# Patient Record
Sex: Male | Born: 2000 | Race: White | Hispanic: No | Marital: Single | Smoking: Current every day smoker
Health system: Southern US, Community
[De-identification: ages and names within clinical notes are randomized; demographics above are authoritative.]

## PROBLEM LIST (undated history)

## (undated) DIAGNOSIS — F319 Bipolar disorder, unspecified: Secondary | ICD-10-CM

## (undated) DIAGNOSIS — F909 Attention-deficit hyperactivity disorder, unspecified type: Secondary | ICD-10-CM

## (undated) DIAGNOSIS — I10 Essential (primary) hypertension: Secondary | ICD-10-CM

## (undated) DIAGNOSIS — E7800 Pure hypercholesterolemia, unspecified: Secondary | ICD-10-CM

## (undated) HISTORY — PX: WISDOM TOOTH EXTRACTION: SHX21

## (undated) HISTORY — PX: OTHER SURGICAL HISTORY: SHX169

---
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
## 2019-05-19 ENCOUNTER — Emergency Department (HOSPITAL_COMMUNITY): Payer: BC Managed Care – PPO

## 2019-05-19 ENCOUNTER — Other Ambulatory Visit: Payer: Self-pay

## 2019-05-19 ENCOUNTER — Encounter (HOSPITAL_COMMUNITY): Payer: Self-pay | Admitting: Emergency Medicine

## 2019-05-19 ENCOUNTER — Emergency Department (HOSPITAL_COMMUNITY)
Admission: EM | Admit: 2019-05-19 | Discharge: 2019-05-19 | Disposition: A | Discharge status: Home / Self Care | Payer: BC Managed Care – PPO | Attending: Emergency Medicine | Admitting: Emergency Medicine

## 2019-05-19 DIAGNOSIS — Z79899 Other long term (current) drug therapy: Secondary | ICD-10-CM | POA: Insufficient documentation

## 2019-05-19 DIAGNOSIS — F17210 Nicotine dependence, cigarettes, uncomplicated: Secondary | ICD-10-CM | POA: Insufficient documentation

## 2019-05-19 DIAGNOSIS — N5089 Other specified disorders of the male genital organs: Secondary | ICD-10-CM | POA: Insufficient documentation

## 2019-05-19 DIAGNOSIS — I10 Essential (primary) hypertension: Secondary | ICD-10-CM | POA: Diagnosis not present

## 2019-05-19 DIAGNOSIS — N50812 Left testicular pain: Secondary | ICD-10-CM | POA: Diagnosis not present

## 2019-05-19 LAB — URINALYSIS, ROUTINE W REFLEX MICROSCOPIC
Bilirubin Urine: NEGATIVE
Glucose, UA: NEGATIVE mg/dL
Hgb urine dipstick: NEGATIVE
Ketones, ur: NEGATIVE mg/dL
Leukocytes,Ua: NEGATIVE
Nitrite: NEGATIVE
Protein, ur: NEGATIVE mg/dL
Specific Gravity, Urine: 1.006 (ref 1.005–1.030)
pH: 6 (ref 5.0–8.0)

## 2019-05-19 MED ORDER — SODIUM CHLORIDE 0.9 % IV BOLUS: 1000.0000 mL | Freq: Once | INTRAVENOUS | Status: DC

## 2019-05-19 MED ORDER — VANCOMYCIN HCL IN DEXTROSE 1-5 GM/200ML-% IV SOLN: 1000.0000 mg | Freq: Once | INTRAVENOUS | Status: DC

## 2019-05-19 NOTE — Discharge Instructions (Addendum)
There were no acute abnormalities on the ultrasound. I suspect your pain may be due to an inguinal hernia.  Do not perform actions or lifting that seem to cause pain. Follow-up with the general surgeon on this matter.  Call the number provided to set up an appointment.  Return to the emergency department for persistent pain, scrotal swelling, abdominal pain, difficulty urinating, pain with urination, pain with bowel movements, or any other major concerns.

## 2019-05-19 NOTE — ED Provider Notes (Signed)
[facility]-EMERGENCY DEPT Provider Note   CSN: [id number] Arrival date & time: 05/19/19  1641     History   Chief Complaint Chief Complaint  Patient presents with  . Testicle Pain    HPI [name] is a 18 y.o. male.     18 y.o male with a PMH of ADHD, HTN, high cholesterol presents to the ED with a chief complaint of testicular pain x 1 week. Patient reports a fullness sensation to his left testicle for the past week, symptoms had began to improve without any intervention. However, he was lifting a heavy couch yesterday when he felt worsening pain which he rates an 8/10. He has not tried any therapy for improvement in symptoms.Patient was seen at [facility] family medicine this morning and sent to the ED for scrotal ultrasound.  He is currently sexually active without protection, last activity 3-4 months ago. He denies any urinary symptoms, penile discharge, abdominal pain or fevers. No prior history of STI.   The history is provided by the patient and medical records.  Testicle Pain This is a new problem. The current episode started more than 2 days ago. The problem occurs constantly. Pertinent negatives include no chest pain, no abdominal pain, no headaches and no shortness of breath.    Past Medical History:  Diagnosis Date  . ADHD (attention deficit hyperactivity disorder)   . Bipolar 1 disorder (HCC)   . High cholesterol   . Hypertension     There are no active problems to display for this patient.   Past Surgical History:  Procedure Laterality Date  . MASS EXCISION Right 05/25/2016   Procedure: EXCISION cyst from right shin;  Surgeon: Leonia Corona, MD;  Location: [facility];  Service: General;  Laterality: Right;  . removal of foreighn body    . WISDOM TOOTH EXTRACTION          Home Medications    Prior to Admission medications   Medication Sig Start Date End Date Taking? Authorizing Provider  ARIPiprazole (ABILIFY)  10 MG tablet Take 10 mg by mouth at bedtime.  03/17/19  Yes [provider]  cloNIDine HCl (KAPVAY) 0.1 MG TB12 ER tablet Take 0.1 mg by mouth at bedtime.   Yes [provider]  fluticasone (FLONASE) 50 MCG/ACT nasal spray Place 2 sprays into both nostrils daily. Patient taking differently: Place 2 sprays into both nostrils daily as needed for allergies.  08/28/18  Yes Wurst, [place], PA-C  benzonatate (TESSALON) 100 MG capsule Take 1 capsule (100 mg total) by mouth every 8 (eight) hours. Patient not taking: Reported on 05/19/2019 08/28/18   Wurst, [place], PA-C  cetirizine-pseudoephedrine (ZYRTEC-D) 5-120 MG tablet Take 1 tablet by mouth daily. Patient not taking: Reported on 05/19/2019 08/28/18   Rennis Harding, PA-C    Family History Family History  Problem Relation Age of Onset  . Hypertension Sister   . Hypertension Maternal Grandmother   . COPD Maternal Grandmother   . Asthma Maternal Grandmother   . Stroke Maternal Grandmother   . Kidney disease Maternal Grandmother   . Diabetes Maternal Grandfather   . Hypertension Paternal Grandfather     Social History Social History   Tobacco Use  . Smoking status: Current Every Day Smoker    Packs/day: 0.50    Types: Cigarettes  . Smokeless tobacco: Never Used  Substance Use Topics  . Alcohol use: No  . Drug use: No     Allergies   Patient has  no known allergies.   Review of Systems Review of Systems  Constitutional: Negative for fever.  HENT: Negative for sore throat.   Respiratory: Negative for shortness of breath.   Cardiovascular: Negative for chest pain.  Gastrointestinal: Negative for abdominal pain, nausea and vomiting.  Genitourinary: Positive for testicular pain. Negative for difficulty urinating, discharge, dysuria, flank pain, genital sores, hematuria, penile pain, penile swelling and scrotal swelling.  Musculoskeletal: Negative for back pain.  Skin: Negative for pallor and wound.   Neurological: Negative for light-headedness and headaches.     Physical Exam Updated Vital Signs BP 121/65 (BP Location: Left Arm)   Pulse 65   Temp 98.9 F (37.2 C) (Oral)   Resp 17   SpO2 99%   Physical Exam Vitals signs and nursing note reviewed. Exam conducted with a chaperone present.  Constitutional:      Appearance: Normal appearance.  HENT:     Head: Normocephalic and atraumatic.     Nose: Nose normal.     Mouth/Throat:     Mouth: Mucous membranes are moist.  Eyes:     Pupils: Pupils are equal, round, and reactive to light.  Neck:     Musculoskeletal: Normal range of motion and neck supple.  Cardiovascular:     Rate and Rhythm: Normal rate.     Heart sounds: No murmur.  Pulmonary:     Effort: Pulmonary effort is normal.     Breath sounds: Normal breath sounds. No wheezing.  Abdominal:     General: Abdomen is flat. Bowel sounds are normal. There is no distension.     Tenderness: There is no abdominal tenderness.  Genitourinary:    Pubic Area: No rash or pubic lice.      Penis: Circumcised.      Scrotum/Testes:        Right: Tenderness, swelling, testicular hydrocele or varicocele not present. Right testis is descended.        Left: Tenderness and swelling present. Varicocele not present. Left testis is descended. Cremasteric reflex is present.      Comments: Chaperoned by Ruthy DickZach RN.  Significant tenderness to palpation along the left scrotum.  No penile discharge, no groin lymphadenopathy. Neurological:     Mental Status: He is alert and oriented to person, place, and time.      ED Treatments / Results  Labs (all labs ordered are listed, but only abnormal results are displayed) Labs Reviewed  URINALYSIS, ROUTINE W REFLEX MICROSCOPIC - Abnormal; Notable for the following components:      Result Value   Color, Urine STRAW (*)    All other components within normal limits  GC/CHLAMYDIA PROBE AMP ([place]) NOT AT [facility]    EKG None  Radiology No  results found.  Procedures Procedures (including critical care time)  Medications Ordered in ED Medications - No data to display   Initial Impression / Assessment and Plan / ED Course  I have reviewed the triage vital signs and the nursing notes.  Pertinent labs & imaging results that were available during my care of the patient were reviewed by me and considered in my medical decision making (see chart for details).       Patient with a past medical history of hypertension presents to the ED with complaints of left testicular pain x1 week.  He reports his symptoms are waxing and waning and certainly improve however, after moving some furniture yesterday he felt increased pain to his left testicle.  He was seen at family  practice today and sent here for an ultrasound of his testicle.  He relates report being sexually active last time he did so was 3 to 4 months ago, reports he has had no penile drainage, penile swelling, lymphadenopathy around the groin.  No urinary symptoms.  My evaluation there is some swelling around the epididymis on his left testicle, right testicle appears unremarkable.  No groin lymphadenopathy.  Vitals are within normal limits.  Will send UA along with ultrasound of his testicles.  Return diagnoses included but not limited to hydrocele, epididymitis, testicular torsion.  UA showed no nitrites, leukocytes, white blood cell count.  GC and Chlamydia have been collected of his urine and are currently pending results.  An ultrasound of his testicles was ordered add 6 PM.  I have personally called ultrasound technician on call Raquel Sarna at [phone].  She was unaware the studies had been ordered therefore there has been a delay in obtaining ultrasound.  Patient is aware he will need his ultrasound prior to disposition.  Patient CARE signed out to Georga Kaufmann, [place] pending ultrasound scrotal.   Portions of this note were generated with Dragon dictation software. Dictation  errors may occur despite best attempts at proofreading.  Final Clinical Impressions(s) / ED Diagnoses   Final diagnoses:  Left testicular pain  Testicular swelling, left    ED Discharge Orders    None       [name], Hershal Coria 05/19/19 2048    Daleen Bo, MD 05/20/19 [phone]

## 2019-05-19 NOTE — ED Provider Notes (Signed)
[name] is a 18 y.o. male, presenting to the ED with groin pain beginning about a week ago.  Last week, he was lifting a heavy piece of metal and felt pain in the left groin.  That pain improved and then resolved with rest.  The pain recurred last night while lifting a couch, resolved with rest.  He has had no trouble having bowel movements and had a bowel movement today. Denies pain with bowel movements, difficulty urinating, pain with urination, scrotal swelling, fever, abdominal pain.   HPI from [name], [name]: "18 y.o male with a PMH of ADHD, HTN, high cholesterol presents to the ED with a chief complaint of testicular pain x 1 week. Patient reports a fullness sensation to his left testicle for the past week, symptoms had began to improve without any intervention. However, he was lifting a heavy couch yesterday when he felt worsening pain which he rates an 8/10. He has not tried any therapy for improvement in symptoms.[name] this [facility] and sent to the ED for scrotal ultrasound.  He is currently sexually active without protection, last activity 3-4 months ago. He denies any urinary symptoms, penile discharge, abdominal pain or fevers. No prior history of STI.   The history is provided by the patient and medical records.  Testicle Pain This is a new problem. The current episode started more than 2 days ago. The problem occurs constantly. Pertinent negatives include no chest pain, no abdominal pain, no headaches and no shortness of breath."   Past Medical History:  Diagnosis Date  . ADHD (attention deficit hyperactivity disorder)   . Bipolar 1 disorder ([place])   . High cholesterol   . Hypertension     Physical Exam  BP 121/65 (BP Location: Left Arm)   Pulse 65   Temp 98.9 F (37.2 C) (Oral)   Resp 17   SpO2 99%   Physical Exam Vitals signs and nursing note reviewed.  Constitutional:      General: He is not in acute distress.  Appearance: He is well-developed. He is not diaphoretic.  HENT:     Head: Normocephalic and atraumatic.     Mouth/Throat:     Mouth: Mucous membranes are moist.     Pharynx: Oropharynx is clear.  Eyes:     Conjunctiva/sclera: Conjunctivae normal.  Neck:     Musculoskeletal: Neck supple.  Cardiovascular:     Rate and Rhythm: Normal rate and regular rhythm.     Pulses: Normal pulses.     Comments: Tactile temperature in the extremities appropriate and equal bilaterally. Pulmonary:     Effort: Pulmonary effort is normal. No respiratory distress.  Abdominal:     Palpations: Abdomen is soft.     Tenderness: There is no abdominal tenderness. There is no guarding.  Genitourinary:    Comments: Genital Exam: Penis, scrotum, and testicles without swelling, lesions, or tenderness. No penile discharge.   No inguinal hernia noted, however, there is tenderness in the left inguinal canal. Cremasteric reflex intact. No inguinal lymphadenopathy.  Otherwise normal male genitalia.  Musculoskeletal:     Right lower leg: No edema.     Left lower leg: No edema.  Lymphadenopathy:     Cervical: No cervical adenopathy.  Skin:    General: Skin is warm and dry.  Neurological:     Mental Status: He is alert.  Psychiatric:        Mood and Affect: Mood and affect normal.  Speech: Speech normal.        Behavior: Behavior normal.     ED Course/Procedures     Procedures   Abnormal Labs Reviewed  URINALYSIS, ROUTINE W REFLEX MICROSCOPIC - Abnormal; Notable for the following components:      Result Value   Color, Urine STRAW (*)    All other components within normal limits    US Scrotum W/doppler  Result Date: 05/19/2019 CLINICAL DATA:  Right testicular pain. EXAM: SCROTAL ULTRASOUND DOPPLER ULTRASOUND OF THE TESTICLES TECHNIQUE: Complete ultrasound examination of the testicles, epididymis, and other scrotal structures was performed. Color and spectral Doppler ultrasound were also utilized  to evaluate blood flow to the testicles. COMPARISON:  None. FINDINGS: Right testicle Measurements: 4.4 x 2.8 x 1.8 cm. Homogeneous echogenicity. Normal blood flow. No mass or microlithiasis visualized. Left testicle Measurements: 4.2 x 2.0 x 2.7 cm. Homogeneous echogenicity. Normal blood flow. No mass or microlithiasis visualized. Right epididymis:  Normal in size and appearance. Left epididymis:  Normal in size and appearance. Hydrocele:  None visualized. Varicocele:  None visualized. Pulsed Doppler interrogation of both testes demonstrates normal low resistance arterial and venous waveforms bilaterally. IMPRESSION: Normal scrotal ultrasound with Doppler. Electronically Signed   By: Narda Rutherford M.D.   On: 05/19/2019 22:00    MDM   Patient care handoff report received from [name], [place]. Plan: Review scrotal ultrasound.  Patient presents with groin pain intermittent over the last week.  No acute abnormality on ultrasound this evening. Based on history elements, I do have a suspicion for inguinal hernia.  Based on the intermittent nature of his pain and the lack of pain during ED course, my suspicion for incarcerated hernia is low. General surgery follow-up.  Strict return precautions discussed.  Patient voices understanding of these instructions, accepts the plan, and is comfortable with discharge.  Vitals:   05/19/19 1707 05/19/19 2005 05/19/19 2230 05/19/19 2324  BP: (!) 154/89 121/65 130/69 134/88  Pulse: 70 65 73 69  Resp: 19 17 18 16   Temp: 98.9 F (37.2 C)     TempSrc: Oral     SpO2: 100% 99% 100% 100%       , PA-C 05/20/19 0010    Little, 05/22/19, MD 05/22/19 [phone]

## 2019-05-19 NOTE — ED Triage Notes (Signed)
Patient here from [facility] for [place] of left testicle. Reports pain 1 week ago, increased 2 days ago. Also states that he was lifting a couch yesterday and pain became a 8/10.

## 2019-05-21 LAB — GC/CHLAMYDIA PROBE AMP (~~LOC~~) NOT AT ARMC
Chlamydia: NEGATIVE
Neisseria Gonorrhea: NEGATIVE

## 2020-04-07 ENCOUNTER — Other Ambulatory Visit: Payer: Self-pay

## 2020-04-07 ENCOUNTER — Encounter (HOSPITAL_COMMUNITY): Payer: Self-pay

## 2020-04-07 ENCOUNTER — Emergency Department (HOSPITAL_COMMUNITY)
Admission: EM | Admit: 2020-04-07 | Discharge: 2020-04-07 | Disposition: A | Discharge status: Home / Self Care | Payer: BC Managed Care – PPO | Attending: Emergency Medicine | Admitting: Emergency Medicine

## 2020-04-07 DIAGNOSIS — X509XXA Other and unspecified overexertion or strenuous movements or postures, initial encounter: Secondary | ICD-10-CM | POA: Diagnosis not present

## 2020-04-07 DIAGNOSIS — F909 Attention-deficit hyperactivity disorder, unspecified type: Secondary | ICD-10-CM | POA: Diagnosis not present

## 2020-04-07 DIAGNOSIS — F17210 Nicotine dependence, cigarettes, uncomplicated: Secondary | ICD-10-CM | POA: Diagnosis not present

## 2020-04-07 DIAGNOSIS — Z79899 Other long term (current) drug therapy: Secondary | ICD-10-CM | POA: Insufficient documentation

## 2020-04-07 DIAGNOSIS — Y999 Unspecified external cause status: Secondary | ICD-10-CM | POA: Insufficient documentation

## 2020-04-07 DIAGNOSIS — Y939 Activity, unspecified: Secondary | ICD-10-CM | POA: Insufficient documentation

## 2020-04-07 DIAGNOSIS — S46211A Strain of muscle, fascia and tendon of other parts of biceps, right arm, initial encounter: Secondary | ICD-10-CM

## 2020-04-07 DIAGNOSIS — I10 Essential (primary) hypertension: Secondary | ICD-10-CM | POA: Diagnosis not present

## 2020-04-07 DIAGNOSIS — Y9289 Other specified places as the place of occurrence of the external cause: Secondary | ICD-10-CM | POA: Insufficient documentation

## 2020-04-07 NOTE — ED Triage Notes (Signed)
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

## 2020-04-07 NOTE — ED Provider Notes (Signed)
[facility] EMERGENCY DEPARTMENT Provider Note  CSN: [id number] Arrival date & time: 04/07/20 1929    History Chief Complaint  Patient presents with  . Arm Pain    HPI  [name] is a 19 y.o. male was at work earlier today pulling down some strapping and felt a sharp pain in his R distal biceps area. 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 Told he had 'rolled his bicep'. Pain has since improved.    Past Medical History:  Diagnosis Date  . ADHD (attention deficit hyperactivity disorder)   . Bipolar 1 disorder (HCC)   . High cholesterol   . Hypertension     Past Surgical History:  Procedure Laterality Date  . MASS EXCISION Right 05/25/2016   Procedure: EXCISION cyst from right shin;  Surgeon: Leonia Corona, MD;  Location: [facility];  Service: General;  Laterality: Right;  . removal of foreighn body    . WISDOM TOOTH EXTRACTION      Family History  Problem Relation Age of Onset  . Hypertension Sister   . Hypertension Maternal Grandmother   . COPD Maternal Grandmother   . Asthma Maternal Grandmother   . Stroke Maternal Grandmother   . Kidney disease Maternal Grandmother   . Diabetes Maternal Grandfather   . Hypertension Paternal Grandfather     Social History   Tobacco Use  . Smoking status: Current Every Day Smoker    Packs/day: 0.50    Types: Cigarettes  . Smokeless tobacco: Never Used  Vaping Use  . Vaping Use: Former  . Start date: 07/10/2016  . Quit date: 05/30/2018  Substance Use Topics  . Alcohol use: No  . Drug use: No     Home Medications Prior to Admission medications   Medication Sig Start Date End Date Taking? Authorizing Provider  ARIPiprazole (ABILIFY) 10 MG tablet Take 10 mg by mouth at bedtime.  03/17/19   [provider]  benzonatate (TESSALON) 100 MG capsule Take 1 capsule (100 mg total) by mouth every 8 (eight) hours. Patient not taking: Reported on  05/19/2019 08/28/18   Wurst, [place], PA-C  cetirizine-pseudoephedrine (ZYRTEC-D) 5-120 MG tablet Take 1 tablet by mouth daily. Patient not taking: Reported on 05/19/2019 08/28/18   Wurst, [place], PA-C  cloNIDine HCl (KAPVAY) 0.1 MG TB12 ER tablet Take 0.1 mg by mouth at bedtime.    [provider]  fluticasone (FLONASE) 50 MCG/ACT nasal spray Place 2 sprays into both nostrils daily. Patient taking differently: Place 2 sprays into both nostrils daily as needed for allergies.  08/28/18   Wurst, [place], PA-C     Allergies    Patient has no known allergies.   Review of Systems   Review of Systems A comprehensive review of systems was completed and negative except as noted in HPI.    Physical Exam BP (!) 151/92 (BP Location: Left Arm)   Pulse 90   Temp 99.4 F (37.4 C) (Oral)   Resp 18   Ht 5\' 8"  (1.727 m)   Wt 67 kg   SpO2 96%   BMI 22.46 kg/m   Physical Exam Vitals and nursing note reviewed.  HENT:     Head: Normocephalic.     Nose: Nose normal.  Eyes:     Extraocular Movements: Extraocular movements intact.  Pulmonary:     Effort: Pulmonary effort is normal.  Musculoskeletal:        General: Tenderness (  distal biceps tendon area) present. No swelling or deformity. Normal range of motion.     Cervical back: Neck supple.  Skin:    Findings: No rash (on exposed skin).  Neurological:     Mental Status: He is alert and oriented to person, place, and time.  Psychiatric:        Mood and Affect: Mood normal.      ED Results / Procedures / Treatments   Labs (all labs ordered are listed, but only abnormal results are displayed) Labs Reviewed - No data to display  EKG None  Radiology No results found.  Procedures Procedures  Medications Ordered in the ED Medications - No data to display   MDM Rules/Calculators/A&P MDM Patient with FROM, no contracted biceps to suggest rupture but could have a partial tear. Recommend sling, NSAIDs, ice and rest.  Ortho followup if not improving. RTED for any other concern.  ED Course  I have reviewed the triage vital signs and the nursing notes.  Pertinent labs & imaging results that were available during my care of the patient were reviewed by me and considered in my medical decision making (see chart for details).     Final Clinical Impression(s) / ED Diagnoses Final diagnoses:  Strain of biceps tendon, right, initial encounter    Rx / DC Orders ED Discharge Orders    None       Pollyann Savoy, MD 04/07/20 2221

## 2020-07-20 ENCOUNTER — Ambulatory Visit
Admission: EM | Admit: 2020-07-20 | Discharge: 2020-07-20 | Disposition: A | Discharge status: Home / Self Care | Payer: BC Managed Care – PPO

## 2020-07-20 ENCOUNTER — Other Ambulatory Visit: Payer: Self-pay

## 2020-07-20 DIAGNOSIS — Z20822 Contact with and (suspected) exposure to covid-19: Secondary | ICD-10-CM | POA: Diagnosis not present

## 2020-07-20 DIAGNOSIS — J069 Acute upper respiratory infection, unspecified: Secondary | ICD-10-CM

## 2020-07-20 MED ORDER — FLUTICASONE PROPIONATE 50 MCG/ACT NA SUSP: 1.0000 | Freq: Every day | NASAL | 0 refills | Status: AC

## 2020-07-20 MED ORDER — BENZONATATE 200 MG PO CAPS: 200.0000 mg | ORAL_CAPSULE | Freq: Three times a day (TID) | ORAL | 0 refills | Status: AC | PRN

## 2020-07-20 NOTE — Discharge Instructions (Signed)
Covid test pending, monitor my chart for results Rest and fluids Ibuprofen and Tylenol for any fevers headaches body aches May use Flonase provided for congestion or over-the-counter Zyrtec/Claritin or Mucinex or Sudafed May use Tessalon for cough or over-the-counter Robitussin Delsym, Dimetapp Follow-up if not improving or worsening

## 2020-07-20 NOTE — ED Provider Notes (Signed)
[facility]    CSN: [id number] Arrival date & time: 07/20/20  0940      History   Chief Complaint Chief Complaint  Patient presents with   Nasal Congestion    HPI [name] is a 19 y.o. male presenting today for evaluation of cough and congestion.  Reports had mild sore throat over the weekend which has since resolved.  Reports cough and congestion over the past 1 to 2 days.  Denies fevers chills or body aches.  Denies GI symptoms.  Denies any close sick contacts.  HPI  Past Medical History:  Diagnosis Date   ADHD (attention deficit hyperactivity disorder)    Bipolar 1 disorder (HCC)    High cholesterol    Hypertension     There are no problems to display for this patient.   Past Surgical History:  Procedure Laterality Date   MASS EXCISION Right 05/25/2016   Procedure: EXCISION cyst from right shin;  Surgeon: Leonia Corona, MD;  Location: [facility];  Service: General;  Laterality: Right;   removal of foreighn body     WISDOM TOOTH EXTRACTION         Home Medications    Prior to Admission medications   Medication Sig Start Date End Date Taking? Authorizing Provider  ARIPiprazole (ABILIFY) 10 MG tablet Take 10 mg by mouth at bedtime.  03/17/19   [provider]  benzonatate (TESSALON) 200 MG capsule Take 1 capsule (200 mg total) by mouth 3 (three) times daily as needed for up to 7 days for cough. 07/20/20 07/27/20  Larita Deremer C, PA-C  busPIRone (BUSPAR) 10 MG tablet Take 10 mg by mouth 1 day or 1 dose.    [provider]  cloNIDine HCl (KAPVAY) 0.1 MG TB12 ER tablet Take 0.1 mg by mouth at bedtime.    [provider]  fluticasone (FLONASE) 50 MCG/ACT nasal spray Place 1-2 sprays into both nostrils daily. 07/20/20   Danalee Flath, Junius Creamer, PA-C    Family History Family History  Problem Relation Age of Onset   Hypertension Sister    Hypertension Maternal Grandmother    COPD Maternal  Grandmother    Asthma Maternal Grandmother    Stroke Maternal Grandmother    Kidney disease Maternal Grandmother    Diabetes Maternal Grandfather    Hypertension Paternal Grandfather     Social History Social History   Tobacco Use   Smoking status: Current Every Day Smoker    Packs/day: 0.50    Types: Cigarettes, E-cigarettes   Smokeless tobacco: Never Used  Vaping Use   Vaping Use: Former   Start date: 07/10/2016   Quit date: 05/30/2018  Substance Use Topics   Alcohol use: No   Drug use: No     Allergies   Patient has no known allergies.   Review of Systems Review of Systems  Constitutional: Negative for activity change, appetite change, chills, fatigue and fever.  HENT: Positive for congestion and rhinorrhea. Negative for ear pain, sinus pressure, sore throat and trouble swallowing.   Eyes: Negative for discharge and redness.  Respiratory: Positive for cough. Negative for chest tightness and shortness of breath.   Cardiovascular: Negative for chest pain.  Gastrointestinal: Negative for abdominal pain, diarrhea, nausea and vomiting.  Musculoskeletal: Negative for myalgias.  Skin: Negative for rash.  Neurological: Negative for dizziness, light-headedness and headaches.     Physical Exam Triage Vital Signs ED Triage Vitals  Enc Vitals Group     BP  Pulse      Resp      Temp      Temp src      SpO2      Weight      Height      Head Circumference      Peak Flow      Pain Score      Pain Loc      Pain Edu?      Excl. in [place]?    No data found.  Updated Vital Signs BP 131/88 (BP Location: Left Arm)    Pulse 89    Temp 99.1 F (37.3 C) (Oral)    Resp 18    SpO2 97%   Visual Acuity Right Eye Distance:   Left Eye Distance:   Bilateral Distance:    Right Eye Near:   Left Eye Near:    Bilateral Near:     Physical Exam Vitals and nursing note reviewed.  Constitutional:      Appearance: He is well-developed and well-nourished.      Comments: No acute distress  HENT:     Head: Normocephalic and atraumatic.     Ears:     Comments: Bilateral ears without tenderness to palpation of external auricle, tragus and mastoid, EAC's without erythema or swelling, TM's with good bony landmarks and cone of light. Non erythematous.     Nose: Nose normal.     Mouth/Throat:     Comments: Oral mucosa pink and moist, no tonsillar enlargement or exudate. Posterior pharynx patent and nonerythematous, no uvula deviation or swelling. Normal phonation. Eyes:     Conjunctiva/sclera: Conjunctivae normal.  Cardiovascular:     Rate and Rhythm: Normal rate.  Pulmonary:     Effort: Pulmonary effort is normal. No respiratory distress.     Comments: Breathing comfortably at rest, CTABL, no wheezing, rales or other adventitious sounds auscultated Abdominal:     General: There is no distension.  Musculoskeletal:        General: Normal range of motion.     Cervical back: Neck supple.  Skin:    General: Skin is warm and dry.  Neurological:     Mental Status: He is alert and oriented to person, place, and time.  Psychiatric:        Mood and Affect: Mood and affect normal.      UC Treatments / Results  Labs (all labs ordered are listed, but only abnormal results are displayed) Labs Reviewed  NOVEL CORONAVIRUS, NAA    EKG   Radiology No results found.  Procedures Procedures (including critical care time)  Medications Ordered in UC Medications - No data to display  Initial Impression / Assessment and Plan / UC Course  I have reviewed the triage vital signs and the nursing notes.  Pertinent labs & imaging results that were available during my care of the patient were reviewed by me and considered in my medical decision making (see chart for details).     Viral URI with cough-Covid test pending, suspect likely viral etiology, vital signs stable, exam reassuring.  Symptomatic and supportive care.  Continue to monitor,Discussed  strict return precautions. Patient verbalized understanding and is agreeable with plan.    Final Clinical Impressions(s) / UC Diagnoses   Final diagnoses:  Encounter for screening laboratory testing for COVID-19 virus  Viral URI with cough     Discharge Instructions     Covid test pending, monitor my chart for results Rest and fluids Ibuprofen and Tylenol  for any fevers headaches body aches May use Flonase provided for congestion or over-the-counter Zyrtec/Claritin or Mucinex or Sudafed May use Tessalon for cough or over-the-counter Robitussin Delsym, Dimetapp Follow-up if not improving or worsening    ED Prescriptions    Medication Sig Dispense Auth. Provider   benzonatate (TESSALON) 200 MG capsule Take 1 capsule (200 mg total) by mouth 3 (three) times daily as needed for up to 7 days for cough. 28 capsule [address] C, PA-C   fluticasone (FLONASE) 50 MCG/ACT nasal spray Place 1-2 sprays into both nostrils daily. 16 g Zaccai Chavarin, [place] C, PA-C     PDMP not reviewed this encounter.   Lew Dawes, PA-C 07/20/20 1008

## 2020-07-20 NOTE — ED Triage Notes (Signed)
Pt c/o nasal/chest congestion with a cough since yesterday. Needs covid testing and work note.

## 2020-07-22 LAB — SARS-COV-2, NAA 2 DAY TAT

## 2020-07-22 LAB — NOVEL CORONAVIRUS, NAA: SARS-CoV-2, NAA: NOT DETECTED

## 2021-12-13 ENCOUNTER — Ambulatory Visit
Admission: EM | Admit: 2021-12-13 | Discharge: 2021-12-13 | Disposition: A | Discharge status: Home / Self Care | Payer: Medicaid Other | Attending: Emergency Medicine | Admitting: Emergency Medicine

## 2021-12-13 ENCOUNTER — Ambulatory Visit (INDEPENDENT_AMBULATORY_CARE_PROVIDER_SITE_OTHER): Payer: Medicaid Other

## 2021-12-13 DIAGNOSIS — S6991XA Unspecified injury of right wrist, hand and finger(s), initial encounter: Secondary | ICD-10-CM | POA: Diagnosis not present

## 2021-12-13 NOTE — ED Provider Notes (Signed)
?[facility] ? ? ? ?CSN: [id number] ?Arrival date & time: 12/13/21  1620 ? ? ?  ? ?History   ?Chief Complaint ?Chief Complaint  ?Patient presents with  ? Hand Pain  ?  R  ? ? ?HPI ?[name] is a 21 y.o. male.  ?Presents after a hand injury. Around 11 this morning patient was hit in the hand with an instrument at work. Had pain and swelling of the hand. Went home and applied ice which helped with swelling but pain has continued. Patient is worried as he is right handed and wants to make sure nothing is broken. ?Pain mostly with extension of fingers. ? ?Past Medical History:  ?Diagnosis Date  ? ADHD (attention deficit hyperactivity disorder)   ? Bipolar 1 disorder ([place])   ? High cholesterol   ? Hypertension   ? ? ?Patient Active Problem List  ? Diagnosis Date Noted  ? Hand injury, right, initial encounter 12/13/2021  ? ? ?Past Surgical History:  ?Procedure Laterality Date  ? MASS EXCISION Right 05/25/2016  ? Procedure: EXCISION cyst from right shin;  Surgeon: Gerald Stabs, MD;  Location: [place];  Service: General;  Laterality: Right;  ? removal of foreighn body    ? WISDOM TOOTH EXTRACTION    ? ? ? ?Home Medications   ? ?Prior to Admission medications   ?Medication Sig Start Date End Date Taking? Authorizing Provider  ?ARIPiprazole (ABILIFY) 10 MG tablet Take 10 mg by mouth at bedtime.  03/17/19   [provider]  ?busPIRone (BUSPAR) 10 MG tablet Take 10 mg by mouth 1 day or 1 dose.    [provider]  ?cloNIDine HCl (KAPVAY) 0.1 MG TB12 ER tablet Take 0.1 mg by mouth at bedtime.    [provider]  ?fluticasone (FLONASE) 50 MCG/ACT nasal spray Place 1-2 sprays into both nostrils daily. 07/20/20   Wieters, Hallie C, PA-C  ? ? ?Family History ?Family History  ?Problem Relation Age of Onset  ? Hypertension Sister   ? Hypertension Maternal Grandmother   ? COPD Maternal Grandmother   ? Asthma Maternal Grandmother   ? Stroke Maternal Grandmother   ? Kidney  disease Maternal Grandmother   ? Diabetes Maternal Grandfather   ? Hypertension Paternal Grandfather   ? ? ?Social History ?Social History  ? ?Tobacco Use  ? Smoking status: Every Day  ?  Packs/day: 0.50  ?  Types: Cigarettes, E-cigarettes  ? Smokeless tobacco: Never  ?Vaping Use  ? Vaping Use: Former  ? Start date: 07/10/2016  ? Quit date: 05/30/2018  ?Substance Use Topics  ? Alcohol use: No  ? Drug use: No  ? ? ? ?Allergies   ?Patient has no known allergies. ? ? ?Review of Systems ?Review of Systems  ?Constitutional: Negative.   ?HENT: Negative.    ?Eyes: Negative.   ?Respiratory: Negative.    ?Cardiovascular: Negative.   ?Gastrointestinal: Negative.   ?Musculoskeletal:   ?     Right hand pain  ?All other systems reviewed and are negative. ? ?Physical Exam ?Triage Vital Signs ?ED Triage Vitals  ?Enc Vitals Group  ?   BP 12/13/21 1720 125/76  ?   Pulse Rate 12/13/21 1720 71  ?   Resp 12/13/21 1720 18  ?   Temp 12/13/21 1720 97.9 ?F (36.6 ?C)  ?   Temp Source 12/13/21 1720 Oral  ?   SpO2 12/13/21 1720 97 %  ?   Weight --   ?  Height --   ?   Head Circumference --   ?   Peak Flow --   ?   Pain Score 12/13/21 1719 8  ?   Pain Loc --   ?   Pain Edu? --   ?   Excl. in [place]? --   ? ?No data found. ? ?Updated Vital Signs ?BP 125/76 (BP Location: Left Arm)   Pulse 71   Temp 97.9 ?F (36.6 ?C) (Oral)   Resp 18   SpO2 97%  ? ? ?Physical Exam ?Vitals and nursing note reviewed.  ?Constitutional:   ?   General: He is not in acute distress. ?   Appearance: He is well-developed.  ?Eyes:  ?   Conjunctiva/sclera: Conjunctivae normal.  ?Cardiovascular:  ?   Rate and Rhythm: Normal rate and regular rhythm.  ?   Heart sounds: Normal heart sounds.  ?Pulmonary:  ?   Effort: Pulmonary effort is normal. No respiratory distress.  ?   Breath sounds: Normal breath sounds.  ?Musculoskeletal:  ?   Right hand: Tenderness present. No swelling or deformity. Decreased range of motion. Normal strength. Normal sensation.  ?   Comments: Pain with  extension of fingers. Mostly located to medial palm at base of 4th and 5th fingers. No snuff box tenderness. Sensation intact. Radial pulse 3+  ?Neurological:  ?   Mental Status: He is alert.  ? ? ?UC Treatments / Results  ?Labs ?(all labs ordered are listed, but only abnormal results are displayed) ?Labs Reviewed - No data to display ? ?EKG ? ?Radiology ?DG Hand Complete Right ? ?Result Date: 12/13/2021 ?CLINICAL DATA:  Right hand pain after trauma. EXAM: RIGHT HAND - COMPLETE 3+ VIEW COMPARISON:  None Available. FINDINGS: There is no evidence of fracture or dislocation. There is no evidence of arthropathy or other focal bone abnormality. Soft tissues are unremarkable. IMPRESSION: No acute osseous abnormality. Electronically Signed   By: Dahlia Bailiff M.D.   On: 12/13/2021 17:59   ? ?Procedures ?Procedures (including critical care time) ? ?Medications Ordered in UC ?Medications - No data to display ? ?Initial Impression / Assessment and Plan / UC Course  ?I have reviewed the triage vital signs and the nursing notes. ? ?Pertinent labs & imaging results that were available during my care of the patient were reviewed by me and considered in my medical decision making (see chart for details). ?  ?Obtained right hand xray in clinic today - shows no abnormality. Discussed with patient to use ibuprofen for pain, ice for swelling, and to continue gentle movements of hand. He can follow up with primary care if symptoms do not improve with symptomatic care. ?Return precautions discussed. Patient agrees to plan and is discharged in stable condition. ? ?Final Clinical Impressions(s) / UC Diagnoses  ? ?Final diagnoses:  ?Hand injury, right, initial encounter  ? ? ? ?Discharge Instructions   ? ?  ?Please return to the urgent care or emergency department if symptoms worsen or do not improve. ? ? ? ?ED Prescriptions   ?None ?  ? ?PDMP not reviewed this encounter. ?  ?Verble Styron, Wells Guiles, PA-C ?12/13/21 1821 ? ?

## 2021-12-13 NOTE — ED Triage Notes (Signed)
Today, while trying to take a tire off Pt reports the instrument "broke loose and struck" his right palm causing a sudden onset of pain. Confirms swelling. No bruising. ?No meds taken. ?

## 2021-12-13 NOTE — Discharge Instructions (Signed)
Please return to the urgent care or emergency department if symptoms worsen or do not improve. ?

## 2022-03-08 ENCOUNTER — Ambulatory Visit
Admission: EM | Admit: 2022-03-08 | Discharge: 2022-03-08 | Disposition: A | Discharge status: Home / Self Care | Payer: Medicaid Other | Attending: Internal Medicine | Admitting: Internal Medicine

## 2022-03-08 DIAGNOSIS — M62838 Other muscle spasm: Secondary | ICD-10-CM | POA: Diagnosis not present

## 2022-03-08 MED ORDER — CYCLOBENZAPRINE HCL 5 MG PO TABS: 5.0000 mg | ORAL_TABLET | Freq: Two times a day (BID) | ORAL | 0 refills | Status: AC | PRN

## 2022-03-08 NOTE — ED Provider Notes (Signed)
[facility]    CSN: [id number] Arrival date & time: 03/08/22  1311      History   Chief Complaint Chief Complaint  Patient presents with   leg spasms    HPI [name] is a 21 y.o. male.   Patient presents with intermittent leg spasms that have been present for about 3 weeks.  Spasming is mainly occurring in the upper thighs but does extend down to the lower legs at times.  He reports that it is mainly worse at night when he is sitting still but is present intermittently throughout the day.  Denies any associated chest pain, shortness of breath, headache, dizziness, blurred vision, nausea, vomiting.  Patient states that this has occurred before and he had low potassium.  Denies any associated urinary symptoms as well.  Denies any injuries to the area.  He reports that he does work outside in [employer] but has been drinking plenty of water.     Past Medical History:  Diagnosis Date   ADHD (attention deficit hyperactivity disorder)    Bipolar 1 disorder (HCC)    High cholesterol    Hypertension     Patient Active Problem List   Diagnosis Date Noted   Hand injury, right, initial encounter 12/13/2021    Past Surgical History:  Procedure Laterality Date   MASS EXCISION Right 05/25/2016   Procedure: EXCISION cyst from right shin;  Surgeon: Leonia Corona, MD;  Location: [facility];  Service: General;  Laterality: Right;   removal of foreighn body     WISDOM TOOTH EXTRACTION         Home Medications    Prior to Admission medications   Medication Sig Start Date End Date Taking? Authorizing Provider  cyclobenzaprine (FLEXERIL) 5 MG tablet Take 1 tablet (5 mg total) by mouth 2 (two) times daily as needed for muscle spasms. 03/08/22  Yes Hamish Banks, Rolly Salter E, FNP  ARIPiprazole (ABILIFY) 10 MG tablet Take 10 mg by mouth at bedtime.  03/17/19   [provider]  busPIRone (BUSPAR) 10 MG tablet Take 10 mg by mouth 1 day or 1 dose.    [provider]  cloNIDine HCl (KAPVAY) 0.1 MG TB12 ER tablet Take 0.1 mg by mouth at bedtime.    [provider]  fluticasone (FLONASE) 50 MCG/ACT nasal spray Place 1-2 sprays into both nostrils daily. 07/20/20   Wieters, Junius Creamer, PA-C    Family History Family History  Problem Relation Age of Onset   Hypertension Sister    Hypertension Maternal Grandmother    COPD Maternal Grandmother    Asthma Maternal Grandmother    Stroke Maternal Grandmother    Kidney disease Maternal Grandmother    Diabetes Maternal Grandfather    Hypertension Paternal Grandfather     Social History Social History   Tobacco Use   Smoking status: Every Day    Packs/day: 0.50    Types: Cigarettes, E-cigarettes   Smokeless tobacco: Never  Vaping Use   Vaping Use: Former   Start date: 07/10/2016   Quit date: 05/30/2018  Substance Use Topics   Alcohol use: No   Drug use: No     Allergies   Patient has no known allergies.   Review of Systems Review of Systems Per HPI  Physical Exam Triage Vital Signs ED Triage Vitals [03/08/22 1343]  Enc Vitals Group     BP 125/72     Pulse Rate 70     Resp 18  Temp 98 F (36.7 C)     Temp Source Oral     SpO2 98 %     Weight      Height      Head Circumference      Peak Flow      Pain Score 0     Pain Loc      Pain Edu?      Excl. in [place]?    No data found.  Updated Vital Signs BP 125/72 (BP Location: Left Arm)   Pulse 70   Temp 98 F (36.7 C) (Oral)   Resp 18   SpO2 98%   Visual Acuity Right Eye Distance:   Left Eye Distance:   Bilateral Distance:    Right Eye Near:   Left Eye Near:    Bilateral Near:     Physical Exam Constitutional:      General: He is not in acute distress.    Appearance: Normal appearance. He is not toxic-appearing or diaphoretic.  HENT:     Head: Normocephalic and atraumatic.     Mouth/Throat:     Mouth: Mucous membranes are moist.     Pharynx: No posterior oropharyngeal erythema.  Eyes:      Extraocular Movements: Extraocular movements intact.     Conjunctiva/sclera: Conjunctivae normal.     Pupils: Pupils are equal, round, and reactive to light.  Cardiovascular:     Rate and Rhythm: Normal rate and regular rhythm.     Pulses: Normal pulses.     Heart sounds: Normal heart sounds.  Pulmonary:     Effort: Pulmonary effort is normal. No respiratory distress.     Breath sounds: Normal breath sounds.  Abdominal:     General: Bowel sounds are normal. There is no distension.     Palpations: Abdomen is soft.     Tenderness: There is no abdominal tenderness.  Musculoskeletal:     Comments: No pain to palpation to lower extremities.  No obvious swelling, discoloration, lacerations, abrasions noted.  Neurovascular intact.  Neurological:     General: No focal deficit present.     Mental Status: He is alert and oriented to person, place, and time. Mental status is at baseline.     Cranial Nerves: Cranial nerves 2-12 are intact.     Sensory: Sensation is intact.     Motor: Motor function is intact.     Coordination: Coordination is intact.     Gait: Gait is intact.     Deep Tendon Reflexes: Reflexes are normal and symmetric.  Psychiatric:        Mood and Affect: Mood normal.        Behavior: Behavior normal.        Thought Content: Thought content normal.        Judgment: Judgment normal.      UC Treatments / Results  Labs (all labs ordered are listed, but only abnormal results are displayed) Labs Reviewed  COMPREHENSIVE METABOLIC PANEL  CBC  CK    EKG   Radiology No results found.  Procedures Procedures (including critical care time)  Medications Ordered in UC Medications - No data to display  Initial Impression / Assessment and Plan / UC Course  I have reviewed the triage vital signs and the nursing notes.  Pertinent labs & imaging results that were available during my care of the patient were reviewed by me and considered in my medical decision making (see  chart for details).     Will  obtain CMP, CBC, CK.  Awaiting results.  Muscle relaxer prescribed for patient as well in case it is musculoskeletal in etiology.  Advised patient that muscle relaxer can cause drowsiness.  Unable to perform stat blood work here in urgent care and patient was advised of this.  Patient voiced understanding.  He was advised to go to the emergency department if symptoms persist or worsen or if he develops chest pain, shortness of breath, headache, dizziness, blurred vision, nausea or vomiting.  Encouraged patient to drink plenty of fluid while outside working in hot weather.  Patient verbalized understanding and was agreeable with plan. Final Clinical Impressions(s) / UC Diagnoses   Final diagnoses:  Muscle spasm     Discharge Instructions      Your blood work is pending.  We will call if it is abnormal and treat as appropriate.  A muscle relaxer has been prescribed for you to take as needed.  Please be advised that it can cause drowsiness so do not drive, operate machinery, drink alcohol with taking this.    ED Prescriptions     Medication Sig Dispense Auth. Provider   cyclobenzaprine (FLEXERIL) 5 MG tablet Take 1 tablet (5 mg total) by mouth 2 (two) times daily as needed for muscle spasms. 20 tablet [place], Michele Rockers, [place]      PDMP not reviewed this encounter.   Teodora Medici, [place] 03/08/22 1419

## 2022-03-08 NOTE — Discharge Instructions (Addendum)
Your blood work is pending.  We will call if it is abnormal and treat as appropriate.  A muscle relaxer has been prescribed for you to take as needed.  Please be advised that it can cause drowsiness so do not drive, operate machinery, drink alcohol with taking this.

## 2022-03-08 NOTE — ED Triage Notes (Signed)
Pt c/o sharp pain and spasms in bilateral legs when he is not using them. Mostly affects him at night but can happen in the day too. States he has hx of low potassium that has caused similar sxs.

## 2022-03-09 LAB — COMPREHENSIVE METABOLIC PANEL
ALT: 27 IU/L (ref 0–44)
AST: 28 IU/L (ref 0–40)
Albumin/Globulin Ratio: 2.8 — ABNORMAL HIGH (ref 1.2–2.2)
Albumin: 5.5 g/dL — ABNORMAL HIGH (ref 4.3–5.2)
Alkaline Phosphatase: 75 IU/L (ref 44–121)
BUN/Creatinine Ratio: 9 (ref 9–20)
BUN: 10 mg/dL (ref 6–20)
Bilirubin Total: 1.9 mg/dL — ABNORMAL HIGH (ref 0.0–1.2)
CO2: 24 mmol/L (ref 20–29)
Calcium: 10.3 mg/dL — ABNORMAL HIGH (ref 8.7–10.2)
Chloride: 98 mmol/L (ref 96–106)
Creatinine, Ser: 1.11 mg/dL (ref 0.76–1.27)
Globulin, Total: 2 g/dL (ref 1.5–4.5)
Glucose: 64 mg/dL — ABNORMAL LOW (ref 70–99)
Potassium: 4.1 mmol/L (ref 3.5–5.2)
Sodium: 141 mmol/L (ref 134–144)
Total Protein: 7.5 g/dL (ref 6.0–8.5)
eGFR: 97 mL/min/{1.73_m2} (ref >59)

## 2022-03-09 LAB — CBC
Hematocrit: 41.7 % (ref 37.5–51.0)
Hemoglobin: 14.7 g/dL (ref 13.0–17.7)
MCH: 30.9 pg (ref 26.6–33.0)
MCHC: 35.3 g/dL (ref 31.5–35.7)
MCV: 88 fL (ref 79–97)
Platelets: 259 10*3/uL (ref 150–450)
RBC: 4.76 x10E6/uL (ref 4.14–5.80)
RDW: 12.2 % (ref 11.6–15.4)
WBC: 7.5 10*3/uL (ref 3.4–10.8)

## 2022-03-09 LAB — CK: Total CK: 137 U/L (ref 49–439)

## 2022-03-10 ENCOUNTER — Telehealth (HOSPITAL_COMMUNITY): Payer: Self-pay | Admitting: Emergency Medicine

## 2022-03-10 ENCOUNTER — Encounter: Payer: Self-pay | Admitting: Emergency Medicine

## 2022-03-10 NOTE — Telephone Encounter (Signed)
Per Rolly Salter APP this RN to call patient to review "blood sugar is low so eat small frequent meals, drink more water. Needs follow up with PCP for recheck of labs and health maintenance. "  Reviewed with patient, and he wants to f/u with PCP for workup on RA.  Will send mychart message with instructions.

## 2022-05-17 ENCOUNTER — Emergency Department (HOSPITAL_BASED_OUTPATIENT_CLINIC_OR_DEPARTMENT_OTHER)
Admission: EM | Admit: 2022-05-17 | Discharge: 2022-05-17 | Disposition: A | Discharge status: Home / Self Care | Payer: Medicaid Other | Attending: Emergency Medicine | Admitting: Emergency Medicine

## 2022-05-17 ENCOUNTER — Encounter (HOSPITAL_BASED_OUTPATIENT_CLINIC_OR_DEPARTMENT_OTHER): Payer: Self-pay

## 2022-05-17 ENCOUNTER — Emergency Department (HOSPITAL_BASED_OUTPATIENT_CLINIC_OR_DEPARTMENT_OTHER): Payer: Medicaid Other

## 2022-05-17 DIAGNOSIS — W228XXA Striking against or struck by other objects, initial encounter: Secondary | ICD-10-CM | POA: Diagnosis not present

## 2022-05-17 DIAGNOSIS — S93402A Sprain of unspecified ligament of left ankle, initial encounter: Secondary | ICD-10-CM | POA: Diagnosis not present

## 2022-05-17 DIAGNOSIS — I10 Essential (primary) hypertension: Secondary | ICD-10-CM | POA: Diagnosis not present

## 2022-05-17 DIAGNOSIS — S99912A Unspecified injury of left ankle, initial encounter: Secondary | ICD-10-CM | POA: Diagnosis present

## 2022-05-17 NOTE — Discharge Instructions (Signed)
You were seen emergency department today for an ankle injury.  As we discussed your x-ray was reassuring.  I think you likely sprained your ankle.  We normally treat this with the RICE method --rest, ice, compression, and elevation.  We have given you an Ace wrap.  You can use ice as needed for swelling.  You can take up to 800 mg of ibuprofen, or up to 1000 mg of Tylenol every 6 hours as needed.  I have attached the contact information for the orthopedic doctor for you to follow-up with if your symptoms persist.

## 2022-05-17 NOTE — ED Notes (Signed)
Discharge instructions reviewed with patient. Patient verbalizes understanding, no further questions at this time. Medications and follow up information provided. No acute distress noted at time of departure.  

## 2022-05-17 NOTE — ED Provider Notes (Signed)
[facility] EMERGENCY DEPARTMENT Provider Note   CSN: [id number] Arrival date & time: 05/17/22  1108     History  Chief Complaint  Patient presents with   Ankle Injury    [name] is a 21 y.o. male history of ADHD, hypertension, bipolar disorder, HLD who presents the emergency department complaining of left ankle pain.  Patient states that he was "beating up" a table using a hammer, and accidentally came back and hit his left inner ankle with the sharp side of the hammer.  Today while he was at work he feels he rolled the same ankle and heard a "pop".  He said pain and swelling since.  Denies any numbness.   Ankle Injury       Home Medications Prior to Admission medications   Medication Sig Start Date End Date Taking? Authorizing Provider  ARIPiprazole (ABILIFY) 10 MG tablet Take 10 mg by mouth at bedtime.  03/17/19   [provider]  busPIRone (BUSPAR) 10 MG tablet Take 10 mg by mouth 1 day or 1 dose.    [provider]  cloNIDine HCl (KAPVAY) 0.1 MG TB12 ER tablet Take 0.1 mg by mouth at bedtime.    [provider]  cyclobenzaprine (FLEXERIL) 5 MG tablet Take 1 tablet (5 mg total) by mouth 2 (two) times daily as needed for muscle spasms. 03/08/22   Gustavus Bryant, FNP  fluticasone (FLONASE) 50 MCG/ACT nasal spray Place 1-2 sprays into both nostrils daily. 07/20/20   Wieters, Hallie C, PA-C      Allergies    Patient has no known allergies.    Review of Systems   Review of Systems  Musculoskeletal:  Positive for arthralgias.  All other systems reviewed and are negative.   Physical Exam Updated Vital Signs BP (!) 143/82 (BP Location: Right Arm)   Pulse 72   Temp 98 F (36.7 C) (Oral)   Resp 16   Ht 5\' 6"  (1.676 m)   Wt 71.7 kg   SpO2 99%   BMI 25.50 kg/m  Physical Exam Vitals and nursing note reviewed.  Constitutional:      Appearance: Normal appearance.  HENT:     Head: Normocephalic and atraumatic.  Eyes:      Conjunctiva/sclera: Conjunctivae normal.  Pulmonary:     Effort: Pulmonary effort is normal. No respiratory distress.  Musculoskeletal:     Comments: Swelling around the medial malleolus of the left foot with associated tenderness, no bony tenderness.  Slightly decreased range of motion of dorsi/plantarflexion due to pain.  Normal range of motion of the digits.  Strong distal pulses.  Normal sensation.  Skin:    General: Skin is warm and dry.  Neurological:     Mental Status: He is alert.  Psychiatric:        Mood and Affect: Mood normal.        Behavior: Behavior normal.     ED Results / Procedures / Treatments   Labs (all labs ordered are listed, but only abnormal results are displayed) Labs Reviewed - No data to display  EKG None  Radiology DG Ankle Complete Left  Result Date: 05/17/2022 CLINICAL DATA:  Injury. 3 days ago accidentally hit medial ankle with a hammer. Today rolled same ankle and heard a pop. Pain and swelling. EXAM: LEFT ANKLE COMPLETE - 3+ VIEW COMPARISON:  Left foot radiographs 02/20/2007 FINDINGS: The ankle mortise is symmetric and intact. Joint space is preserved. No acute fracture is seen. No dislocation.  Possible minimal medial malleolar soft tissue swelling. IMPRESSION: Possible minimal medial malleolar soft tissue swelling. No acute fracture. Electronically Signed   By: Yvonne Kendall M.D.   On: 05/17/2022 11:56    Procedures Procedures    Medications Ordered in ED Medications - No data to display  ED Course/ Medical Decision Making/ A&P                           Medical Decision Making Amount and/or Complexity of Data Reviewed Radiology: ordered.   This patient is a 21 y.o. male who presents to the ED for concern of left ankle pain.   Differential diagnoses prior to evaluation: Fracture, dislocation, sprain, septic joint, gout  Past Medical History / Social History / Additional history: Chart reviewed. Pertinent results include: ADHD,  hypertension, bipolar disorder, HLD  Physical Exam: Physical exam performed. The pertinent findings include: Swelling around the medial malleolus of the left foot with associated tenderness, no bony tenderness.   Imaging: X-ray performed of the left ankle shows minimal medial malleolus soft tissue swelling, no fracture dislocation  Medications / Treatment: Patient given ice pack and Ace wrap provided.  Patient declined crutches.   Disposition: After consideration of the diagnostic results and the patients response to treatment, I feel that emergency department workup does not suggest an emergent condition requiring admission or immediate intervention beyond what has been performed at this time. The plan is: Treat symptomatically with RICE method and over-the-counter medications for likely left ankle sprain.  Provided orthopedic follow-up if symptoms persist.. The patient is safe for discharge and has been instructed to return immediately for worsening symptoms, change in symptoms or any other concerns.  Final Clinical Impression(s) / ED Diagnoses Final diagnoses:  Sprain of left ankle, unspecified ligament, initial encounter    Rx / DC Orders ED Discharge Orders     None      Portions of this report may have been transcribed using voice recognition software. Every effort was made to ensure accuracy; however, inadvertent computerized transcription errors may be present.    Estill Cotta 05/17/22 1250    Margette Fast, MD 05/18/22 [phone]

## 2022-05-17 NOTE — ED Triage Notes (Signed)
Sunday accidentally hit left medial ankle with a hammer, today at work he rolled the same ankle and heard a pop. C/o pain and swelling. Ambulatory to triage.

## 2023-07-21 IMAGING — DX DG HAND COMPLETE 3+V*R*
3 series · 3 of 3 positions shown · non-contrast
Comparison: None Available.

CLINICAL DATA: Right hand pain after trauma.

EXAM:
RIGHT HAND - COMPLETE 3+ VIEW

[hand pa]
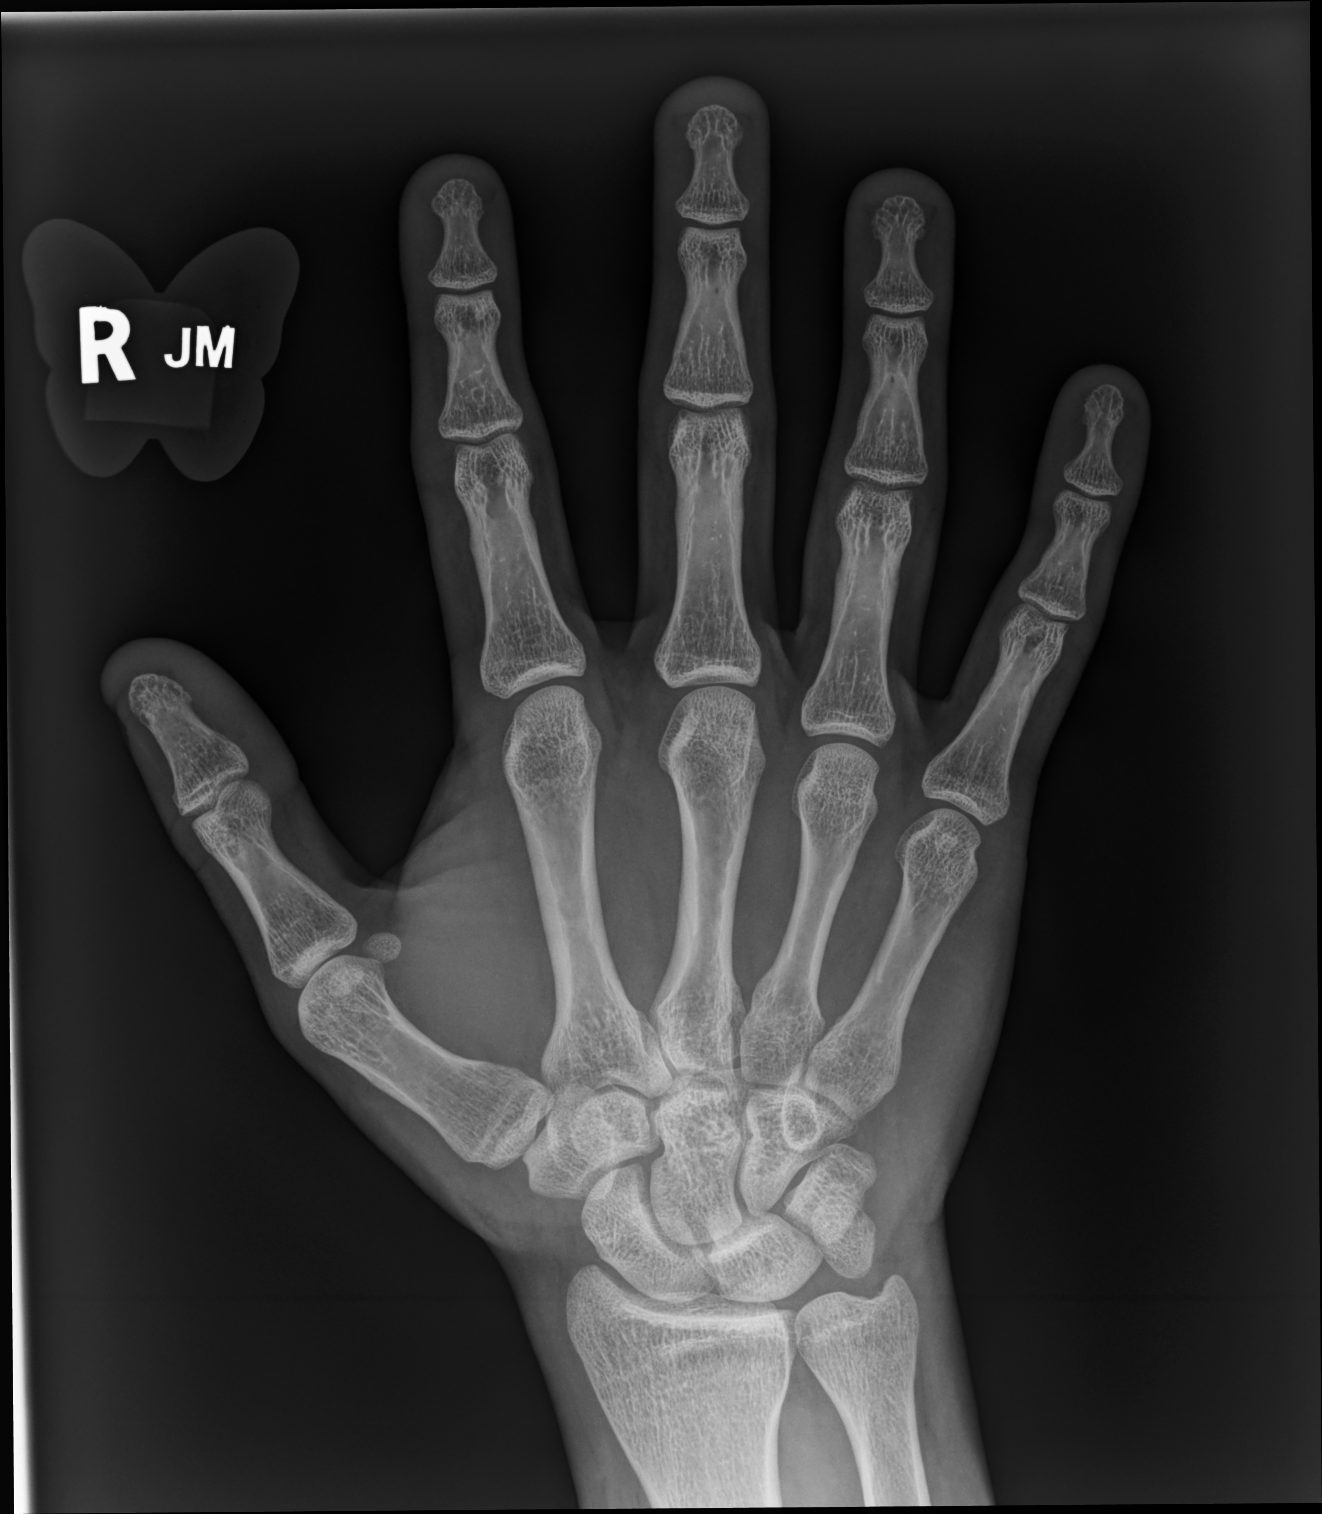

[hand mlo]
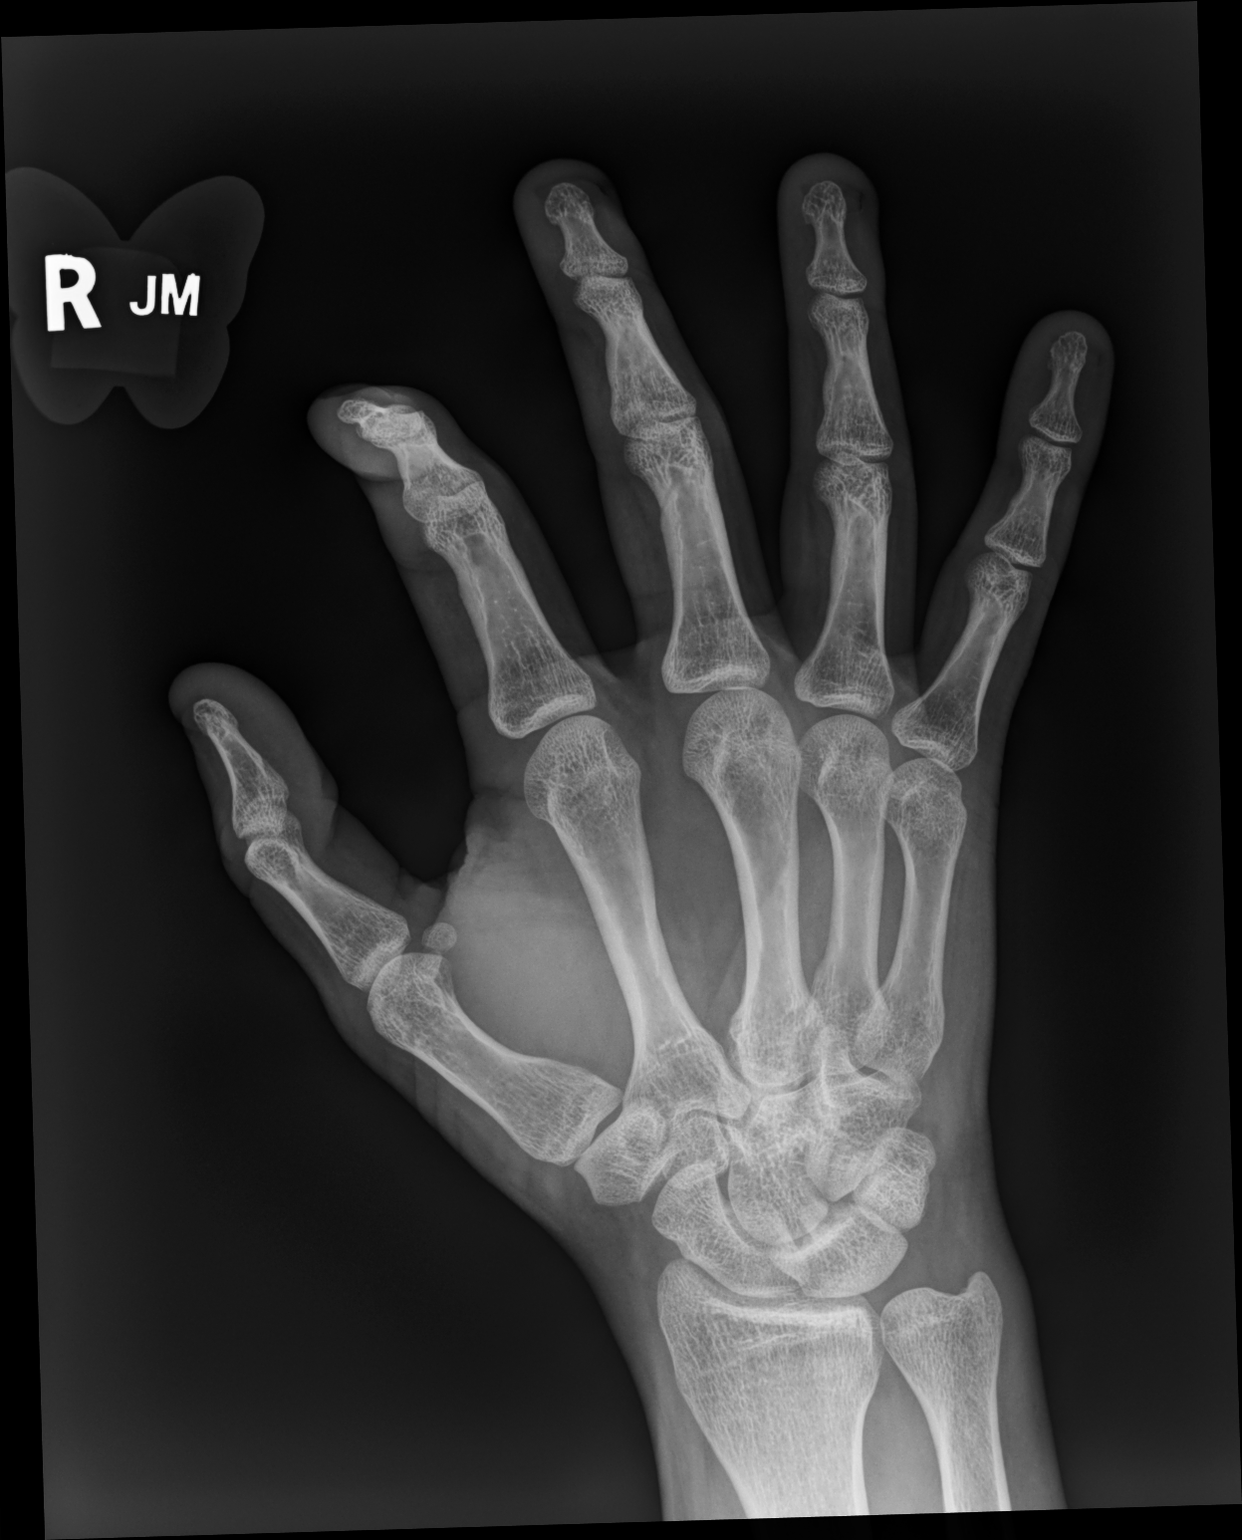

[hand lat]
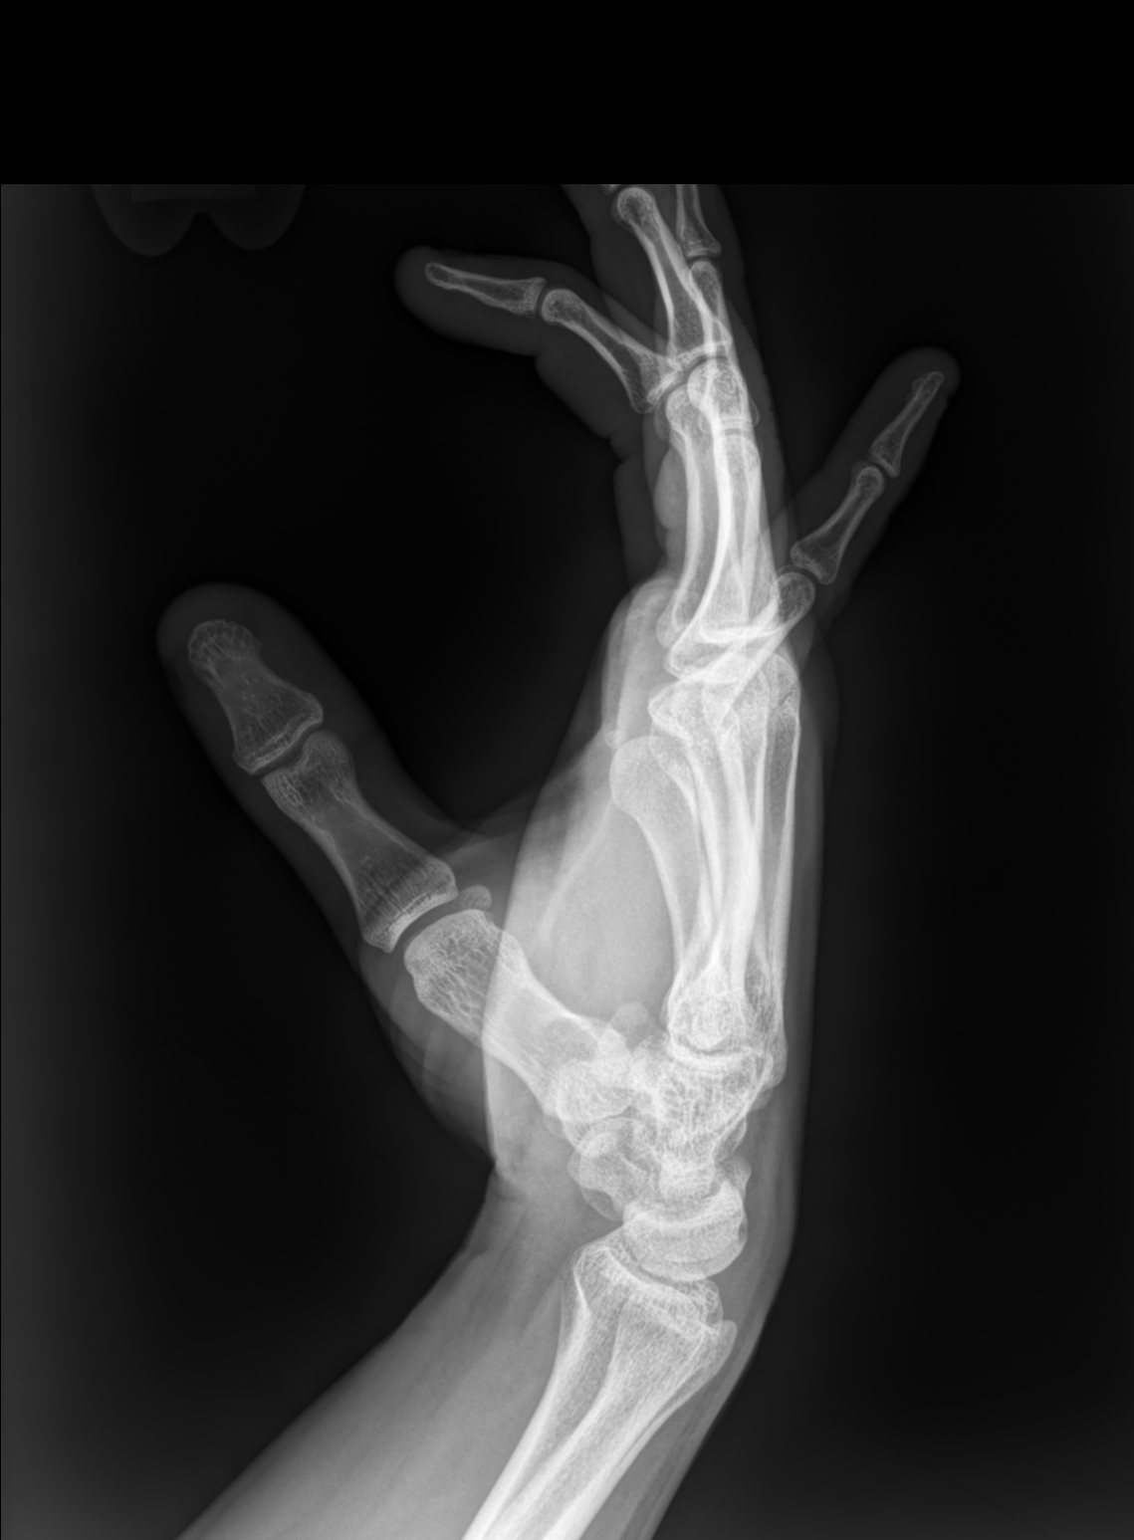

[3 of 3 positions shown; findings below may reference images not displayed]

FINDINGS: There is no evidence of fracture or dislocation. There is no
evidence of arthropathy or other focal bone abnormality. Soft
tissues are unremarkable.
IMPRESSION: No acute osseous abnormality.

## 2024-01-17 ENCOUNTER — Encounter: Payer: Self-pay | Admitting: Emergency Medicine

## 2024-01-17 ENCOUNTER — Ambulatory Visit
Admission: EM | Admit: 2024-01-17 | Discharge: 2024-01-17 | Disposition: A | Discharge status: Home / Self Care | Source: Home / Self Care

## 2024-01-17 DIAGNOSIS — J209 Acute bronchitis, unspecified: Secondary | ICD-10-CM | POA: Diagnosis not present

## 2024-01-17 MED ORDER — PREDNISONE 20 MG PO TABS: 40.0000 mg | ORAL_TABLET | Freq: Every day | ORAL | 0 refills | Status: AC

## 2024-01-17 NOTE — ED Provider Notes (Signed)
 [facility]    CSN: [id number] Arrival date & time: 01/17/24  1009      History   Chief Complaint Chief Complaint  Patient presents with   Cough    HPI [name] is a 23 y.o. male.   Patient here today for evaluation of nonproductive dry cough that started 3 weeks ago.  He reports initially he had cold-like symptoms and these resolved but cough has continued specifically when he gets hot.  He denies any nausea or vomiting.  He has taken over-the-counter medication which did help with cold symptoms but has not resolved cough.  He denies any history of asthma or other respiratory issue.  The history is provided by the patient.    Past Medical History:  Diagnosis Date   ADHD (attention deficit hyperactivity disorder)    Bipolar 1 disorder (HCC)    High cholesterol    Hypertension     Patient Active Problem List   Diagnosis Date Noted   Hand injury, right, initial encounter 12/13/2021    Past Surgical History:  Procedure Laterality Date   MASS EXCISION Right 05/25/2016   Procedure: EXCISION cyst from right shin;  Surgeon: Alanda Allegra, MD;  Location: [facility];  Service: General;  Laterality: Right;   removal of foreighn body     WISDOM TOOTH EXTRACTION         Home Medications    Prior to Admission medications   Medication Sig Start Date End Date Taking? Authorizing Provider  predniSONE (DELTASONE) 20 MG tablet Take 2 tablets (40 mg total) by mouth daily with breakfast for 5 days. 01/17/24 01/22/24 Yes Vernestine Gondola, PA-C  ARIPiprazole (ABILIFY) 10 MG tablet Take 10 mg by mouth at bedtime.  03/17/19   [provider]  busPIRone (BUSPAR) 10 MG tablet Take 10 mg by mouth 1 day or 1 dose.    [provider]  cloNIDine HCl (KAPVAY) 0.1 MG TB12 ER tablet Take 0.1 mg by mouth at bedtime.    [provider]  cyclobenzaprine  (FLEXERIL ) 5 MG tablet Take 1 tablet (5 mg total) by mouth 2 (two) times daily as  needed for muscle spasms. 03/08/22   Dodson Freestone, FNP  fluticasone  (FLONASE ) 50 MCG/ACT nasal spray Place 1-2 sprays into both nostrils daily. 07/20/20   Wieters, Hallie C, PA-C    Family History Family History  Problem Relation Age of Onset   Hypertension Sister    Hypertension Maternal Grandmother    COPD Maternal Grandmother    Asthma Maternal Grandmother    Stroke Maternal Grandmother    Kidney disease Maternal Grandmother    Diabetes Maternal Grandfather    Hypertension Paternal Grandfather     Social History Social History   Tobacco Use   Smoking status: Every Day    Types: E-cigarettes    Passive exposure: Past   Smokeless tobacco: Never  Vaping Use   Vaping status: Former   Start date: 07/10/2016   Quit date: 05/30/2018  Substance Use Topics   Alcohol use: Yes   Drug use: No     Allergies   Patient has no known allergies.   Review of Systems Review of Systems  Constitutional:  Negative for chills and fever.  HENT:  Negative for congestion, ear pain and sore throat.   Eyes:  Negative for discharge and redness.  Respiratory:  Positive for cough and shortness of breath (at times at work with cough).   Gastrointestinal:  Negative for abdominal  pain, nausea and vomiting.     Physical Exam Triage Vital Signs ED Triage Vitals  Encounter Vitals Group     BP      Girls Systolic BP Percentile      Girls Diastolic BP Percentile      Boys Systolic BP Percentile      Boys Diastolic BP Percentile      Pulse      Resp      Temp      Temp src      SpO2      Weight      Height      Head Circumference      Peak Flow      Pain Score      Pain Loc      Pain Education      Exclude from Growth Chart    No data found.  Updated Vital Signs BP (!) 149/86 (BP Location: Left Arm)   Pulse 68   Temp 98 F (36.7 C) (Oral)   Resp 16   Wt 158 lb 1.1 oz (71.7 kg)   SpO2 98%   BMI 25.51 kg/m   Visual Acuity Right Eye Distance:   Left Eye Distance:    Bilateral Distance:    Right Eye Near:   Left Eye Near:    Bilateral Near:     Physical Exam Vitals and nursing note reviewed.  Constitutional:      General: He is not in acute distress.    Appearance: Normal appearance. He is not ill-appearing.  HENT:     Head: Normocephalic and atraumatic.     Nose: Nose normal. No congestion.     Mouth/Throat:     Mouth: Mucous membranes are moist.   Eyes:     Conjunctiva/sclera: Conjunctivae normal.    Cardiovascular:     Rate and Rhythm: Normal rate and regular rhythm.     Heart sounds: Normal heart sounds. No murmur heard. Pulmonary:     Effort: Pulmonary effort is normal. No respiratory distress.     Breath sounds: Normal breath sounds. No wheezing, rhonchi or rales.   Skin:    General: Skin is warm and dry.   Neurological:     Mental Status: He is alert.   Psychiatric:        Mood and Affect: Mood normal.        Thought Content: Thought content normal.      UC Treatments / Results  Labs (all labs ordered are listed, but only abnormal results are displayed) Labs Reviewed - No data to display  EKG   Radiology No results found.  Procedures Procedures (including critical care time)  Medications Ordered in UC Medications - No data to display  Initial Impression / Assessment and Plan / UC Course  I have reviewed the triage vital signs and the nursing notes.  Pertinent labs & imaging results that were available during my care of the patient were reviewed by me and considered in my medical decision making (see chart for details).    Suspect most likely bronchitis and will treat with steroid burst.  Recommended over-the-counter medication if needed for cough and follow-up if no gradual improvement or with any further concerns.  Final Clinical Impressions(s) / UC Diagnoses   Final diagnoses:  Acute bronchitis, unspecified organism   Discharge Instructions   None    ED Prescriptions     Medication Sig  Dispense Auth. Provider   predniSONE (DELTASONE) 20 MG tablet  Take 2 tablets (40 mg total) by mouth daily with breakfast for 5 days. 10 tablet Vernestine Gondola, PA-C      PDMP not reviewed this encounter.   Vernestine Gondola, PA-C 01/17/24 1100

## 2024-01-17 NOTE — ED Triage Notes (Signed)
 Pt presents c/o a lingering non productive dry cough from a cold the pt had 3 weeks ago. Pt denies emesis.

## 2024-05-19 ENCOUNTER — Ambulatory Visit: Admitting: Family Medicine
